# Patient Record
Sex: Female | Born: 1975 | Race: White | Hispanic: No | Marital: Single | Smoking: Current every day smoker
Health system: Southern US, Community
[De-identification: ages and names within clinical notes are randomized; demographics above are authoritative.]

## PROBLEM LIST (undated history)

## (undated) HISTORY — PX: DILATION AND CURETTAGE OF UTERUS: SHX78

## (undated) HISTORY — PX: CHOLECYSTECTOMY: SHX55

## (undated) HISTORY — PX: TUBAL LIGATION: SHX77

---

## 2015-12-12 ENCOUNTER — Emergency Department (HOSPITAL_COMMUNITY): Payer: BLUE CROSS/BLUE SHIELD

## 2015-12-12 ENCOUNTER — Encounter (HOSPITAL_COMMUNITY): Payer: Self-pay | Admitting: *Deleted

## 2015-12-12 ENCOUNTER — Emergency Department (HOSPITAL_COMMUNITY)
Admission: EM | Admit: 2015-12-12 | Discharge: 2015-12-12 | Disposition: A | Discharge status: Home / Self Care | Payer: BLUE CROSS/BLUE SHIELD | Attending: Emergency Medicine | Admitting: Emergency Medicine

## 2015-12-12 DIAGNOSIS — Y998 Other external cause status: Secondary | ICD-10-CM | POA: Diagnosis not present

## 2015-12-12 DIAGNOSIS — S93602A Unspecified sprain of left foot, initial encounter: Secondary | ICD-10-CM

## 2015-12-12 DIAGNOSIS — Y9289 Other specified places as the place of occurrence of the external cause: Secondary | ICD-10-CM | POA: Diagnosis not present

## 2015-12-12 DIAGNOSIS — S99922A Unspecified injury of left foot, initial encounter: Secondary | ICD-10-CM | POA: Diagnosis present

## 2015-12-12 DIAGNOSIS — W108XXA Fall (on) (from) other stairs and steps, initial encounter: Secondary | ICD-10-CM | POA: Insufficient documentation

## 2015-12-12 DIAGNOSIS — Y9389 Activity, other specified: Secondary | ICD-10-CM | POA: Diagnosis not present

## 2015-12-12 MED ORDER — OXYCODONE-ACETAMINOPHEN 5-325 MG PO TABS: 1.0000 | ORAL_TABLET | ORAL | Status: DC | PRN

## 2015-12-12 MED ORDER — OXYCODONE-ACETAMINOPHEN 5-325 MG PO TABS
1.0000 | ORAL_TABLET | Freq: Once | ORAL | Status: AC
  Administered 2015-12-12: 1 via ORAL
  Filled 2015-12-12: qty 1

## 2015-12-12 NOTE — ED Provider Notes (Signed)
CSN: [id number]     Arrival date & time 12/12/15  [id number] History   First MD Initiated Contact with Patient 12/12/15 [phone]     Chief Complaint  Patient presents with  . Fall    pain  to left foot while going down the stairs to basement per patient     (Consider location/radiation/quality/duration/timing/severity/associated sxs/prior Treatment) The history is provided by the patient.   40 year old female fell down about 4 steps injuring her left foot. She states the toes of her left foot curled under her. She rates pain at 8/10 with pain being concentrated in the first, second, third toes. She is unable to bear weight. She denies head, neck, back injury and denies other extremity injury. She did treat herself with MR nursing the foot in a bucket of ice water and took 800 mg ibuprofen.  No past medical history on file. No past surgical history on file. No family history on file. Social History  Substance Use Topics  . Smoking status: Not on file  . Smokeless tobacco: Not on file  . Alcohol Use: Not on file   OB History    No data available     Review of Systems  All other systems reviewed and are negative.     Allergies  Review of patient's allergies indicates not on file.  Home Medications   Prior to Admission medications   Not on File   BP 122/78 mmHg  Pulse 71  Temp(Src) 98.1 F (36.7 C) (Oral)  Resp 18  Ht 5\' 6"  (1.676 m)  Wt 185 lb (83.915 kg)  BMI 29.87 kg/m2  SpO2 100%  LMP 11/21/2015 Physical Exam  Nursing note and vitals reviewed.  40 year old female, resting comfortably and in no acute distress. Vital signs are normal. Oxygen saturation is 100%, which is normal. Head is normocephalic and atraumatic. PERRLA, EOMI. Oropharynx is clear. Neck is nontender and supple without adenopathy or JVD. Back is nontender and there is no CVA tenderness. Lungs are clear without rales, wheezes, or rhonchi. Chest is nontender. Heart has regular rate and rhythm without  murmur. Abdomen is soft, flat, nontender without masses or hepatosplenomegaly and peristalsis is normoactive. Extremities have no cyanosis or edema. There is moderate tenderness to palpation over the left second and third toes with moderate to severe tenderness to palpation over the left first toe and distal aspect of the left first metacarpal. No deformity is seen and there is minimal swelling. Capillary refill is prompt and sensation is normal. No other extremity injury is seen. Skin is warm and dry without rash. Neurologic: Mental status is normal, cranial nerves are intact, there are no motor or sensory deficits.  ED Course  Procedures (including critical care time) I have personally reviewed and evaluated these images and lab results as part of my medical decision-making.   MDM   Final diagnoses:  Fall down steps, initial encounter  Sprain of left foot, initial encounter    Fall with injury to left foot. X-rays have been ordered. She has no prior records in the [facility] system.  The first metatarsal itself is a well-corticated. Final radiology opinion is pending and patient case is signed out to Dr. Cyndie ChimeNguyen. She is placed in a postop shoe and given crutches and prescription for oxycodone-acetaminophen and will be referred to orthopedics for follow-up.  Dione Boozeavid Kathlene Yano, MD 12/12/15 2259

## 2015-12-12 NOTE — ED Notes (Signed)
Patient with no complaints at this time. Respirations even and unlabored. Skin warm/dry. Discharge instructions reviewed with patient at this time. Patient given opportunity to voice concerns/ask questions. Patient discharged at this time and left Emergency Department with steady gait.   

## 2015-12-12 NOTE — ED Provider Notes (Signed)
Patient signed out pending formal xray read.  Possible small avulsion fracture on xray.  Patient updated on results and plan of care.  Instructed to be non weight bearing and to follow up.  Patient expressed understanding and agreement with plan of care.  Discharged in stable condition.  Leta BaptistEmily Roe Emmet Messer, MD 12/12/15 [phone]

## 2015-12-12 NOTE — Discharge Instructions (Signed)
Wear post-op shoe, use crutches as needed. Take ibuprofen as needed for less severe pain.   Avulsion Fracture of the Foot An avulsion fracture of the foot is when a piece of bone in your foot has been torn away. Bones are connected to other bones by strong bands of connective tissue (ligaments). Muscles are also connected to bones with connective tissue (tendons). Avulsion fractures occur when severe stress on a bone from a ligament or tendon causes a small piece of bone to be pulled away.  Athletes may develop an avulsion fracture of the foot gradually (chronic avulsion fracture). The heel bone and the long bone in the foot that connect to the fifth toe (fifth metatarsal bone) are common areas of avulsion fracture of the foot.  CAUSES  An avulsion fracture of the foot can be caused by a sudden or repetitive twisting of your foot or ankle. It can also occur during a fall from a standing height.  RISK FACTORS You may have a higher risk of an avulsion fracture of the foot if you:   Participate in activities during which twisting the ankle or foot are likely, such as:  Dancing.  Track and field.  Walking or hiking on uneven surfaces.  Have had diabetes for many years.  Have osteoporosis. SIGNS AND SYMPTOMS The most common symptom of an avulsion fracture of the foot is intense pain at the time of injury. You may also feel a pop or tearing. The pain continues after the injury. Other signs and symptoms may include:  Swelling.  Bruising.  Pain with movement or weight bearing.  Difficulty walking.  Pain when pressure is applied to the injured area.  Warmth over the injured area. DIAGNOSIS  An avulsion fracture of the foot may be diagnosed by:  History. Your health care provider will ask you what occurred during the time of your injury and whether you had any pain in the area before your injury.  Physical exam. During the exam, your health care provider may try to move your foot,  toes, and ankle to check for pain and level of mobility.  X-ray. This will show if any bones are fractured or out of place.  MRI. This will show your tendons and ligaments. Some avulsion fractures are associated with an injury to a tendon or ligament. TREATMENT  Treatment for an avulsion fracture of the foot depends on the size of the displaced piece of bone and how far it has been pulled out of place. Small avulsion fractures may be treated with rest and support in a cast or brace. Large fragments of bone usually need to be reattached surgically. Treatment of these fractures may also require physical therapy to regain full use of your foot.  Treatments may include:  Rest, ice, compression, and elevations (RICE treatment) as directed by your health care provider.  Medicines that reduce pain and swelling (NSAIDs).  Wearing a splint, elastic wrap, support boot, or cast as directed by your health care provider.  Crutches or a rolling scooter to support your body weight until your foot heals.  Surgery to reattach the bone and tendon or ligament.  Physical therapy. This may last for several months. HOME CARE INSTRUCTIONS  Take medicines only as directed by your health care provider.  Rest your foot until your health care provider says you can resume activity.  Keep your foot raised above the level of your heart when you are sitting or lying down.  Apply ice to the injured area:  Put ice in a plastic bag.  Place a towel between your skin and the bag.  Leave the ice on for 20 minutes, 2-3 times a day.  Do not allow your cast or splint to get wet as directed by your health care provider.  Keep all follow-up visits as directed by your health care provider. This is important. SEEK MEDICAL CARE IF:  Your pain gets worse.  You have chills or fever.  Your cast or splint is damaged.  The cast has a bad odor or has stains caused by fluids from the wound. SEEK IMMEDIATE MEDICAL CARE  IF:  Your foot is cold, blue, or pale.  You have pain, swelling, redness, or numbness below your cast or splint.   This information is not intended to replace advice given to you by your health care provider. Make sure you discuss any questions you have with your health care provider.   Document Released: 06/17/2014 Document Reviewed: 06/17/2014 Elsevier Interactive Patient Education [employer]2016 [employer] Inc.

## 2015-12-14 ENCOUNTER — Encounter: Payer: Self-pay | Admitting: *Deleted

## 2015-12-14 ENCOUNTER — Ambulatory Visit (INDEPENDENT_AMBULATORY_CARE_PROVIDER_SITE_OTHER): Payer: BLUE CROSS/BLUE SHIELD | Admitting: Orthopedic Surgery

## 2015-12-14 ENCOUNTER — Encounter: Payer: Self-pay | Admitting: Orthopedic Surgery

## 2015-12-14 VITALS — BP 118/71 | Ht 66.0 in | Wt 185.0 lb

## 2015-12-14 DIAGNOSIS — S93529A Sprain of metatarsophalangeal joint of unspecified toe(s), initial encounter: Secondary | ICD-10-CM | POA: Diagnosis not present

## 2015-12-14 NOTE — Progress Notes (Signed)
Patient ID: Elizabeth Richards, female   DOB: 05/05/1976, 40 y.o.   MRN: [id number]  Chief Complaint  Patient presents with  . Toe Injury    toes fracture, left foot, DOI 12/12/15    HPI Elizabeth IshikawaChristi Richards is a 40 y.o. female.  The patient reports that she slipped on her basement steps are toe folded up under her and she complains of pain over the right great toe at the MTP joint. Quality dull ache. Severity 7+ out of 10 duration 2 days time and constant context as described  Review of Systems Review of Systems Neurologic symptoms are negative skin is intact no lacerations, no redness, no fever no chills  Patient has no recent or significant medical history surgical history or family history  Social History Social History  Substance Use Topics  . Smoking status: Never Smoker   . Smokeless tobacco: Not on file  . Alcohol Use: Not on file    No Known Allergies  Current Outpatient Prescriptions  Medication Sig Dispense Refill  . citalopram (CELEXA) 40 MG tablet Take 40 mg by mouth daily.    . Multiple Vitamin (MULTIVITAMIN) capsule Take 1 capsule by mouth daily.    Marland Kitchen. omeprazole (PRILOSEC) 20 MG capsule Take 20 mg by mouth daily.    Marland Kitchen. oxyCODONE-acetaminophen (PERCOCET) 5-325 MG tablet Take 1 tablet by mouth every 4 (four) hours as needed for moderate pain. 20 tablet 0   No current facility-administered medications for this visit.       Physical Exam Physical Exam Blood pressure 118/71, height 5\' 6"  (1.676 m), weight 185 lb (83.915 kg), last menstrual period 11/21/2015. Appearance, there are no abnormalities in terms of appearance the patient was well-developed and well-nourished. The grooming and hygiene were normal.  Mental status orientation, there was normal alertness and orientation Mood pleasant Ambulatory abnormal gait secondary to crutches pain and inability to bear full weight  Examination of the right foot Inspection tenderness and swelling over the great toe MTP joint Range  of motion painful passive range of motion pain limits overall range of motion Tests for stability tests for stability are deferred because of pain and swelling Motor strength  muscle tone normal Skin warm dry and intact without laceration or ulceration or erythema Neurologic examination normal sensation, balance is normal, coordination normal Vascular examination normal pulses with warm extremity and normal capillary refill     Data Reviewed XRAYS, I INTERPRET AS AVULSION fracture of the metatarsal head making this most likely a metatarsal phalangeal joint sprain with avulsion fracture  Assessment  Turf toe MTP joint sprain right foot great toe   Plan  Weight-bear as tolerated, out of work next 4 weeks recheck 4 weeks  short Cam Walker

## 2015-12-14 NOTE — Patient Instructions (Signed)
Out of work 4 weeks 

## 2015-12-22 ENCOUNTER — Telehealth: Payer: Self-pay | Admitting: *Deleted

## 2015-12-22 NOTE — Telephone Encounter (Signed)
Patient called requesting something for pain in her left foot, pain is mainly occuring at night when she gets off of it. Please advise [phone] patient uses [facility] in [place].

## 2015-12-23 ENCOUNTER — Other Ambulatory Visit: Payer: Self-pay | Admitting: *Deleted

## 2015-12-23 MED ORDER — IBUPROFEN 800 MG PO TABS: 800.0000 mg | ORAL_TABLET | Freq: Three times a day (TID) | ORAL | Status: DC | PRN

## 2015-12-23 NOTE — Telephone Encounter (Signed)
IBUPROFEN 800 TID 90TABS

## 2015-12-23 NOTE — Telephone Encounter (Signed)
Routing to Dr Harrison for review 

## 2015-12-23 NOTE — Telephone Encounter (Signed)
MEDICATION SENT, PATIENT AWARE 

## 2016-01-11 ENCOUNTER — Encounter: Payer: Self-pay | Admitting: Orthopedic Surgery

## 2016-01-11 ENCOUNTER — Ambulatory Visit (INDEPENDENT_AMBULATORY_CARE_PROVIDER_SITE_OTHER): Payer: BLUE CROSS/BLUE SHIELD | Admitting: Orthopedic Surgery

## 2016-01-11 VITALS — BP 107/66 | Ht 66.0 in | Wt 185.0 lb

## 2016-01-11 DIAGNOSIS — S93529D Sprain of metatarsophalangeal joint of unspecified toe(s), subsequent encounter: Secondary | ICD-10-CM

## 2016-01-11 NOTE — Progress Notes (Signed)
Patient ID: Elizabeth Richards, female   DOB: 12-22-1975, 40 y.o.   MRN: [id number]  Chief Complaint  Patient presents with  . Follow-up    4 WEEK FOLLOW UP RIGHT GREAT TOE, DOI 12/12/15    BP 107/66 mmHg  Ht  (1.676 m)  Wt 185 lb (83.915 kg)  BMI 29.87 kg/m2  LMP 11/21/2015  Patient injured her great toe on the right foot we treated her with immobilization. She is now a month out. She complains of no discomfort  Review of systems stiffness in the great toe  Exam vital signs stable as recorded  Patient awake alert and oriented 3 Mood and affect normal Amplitude status returned to normal shoes normal gait Stiffness in the toe but passive range of motion normal minimal tenderness no instability neurovascular exam intact    ASSESSMENT AND PLAN   Resolved turf toe  Passive range of motion exercises instructed patient will do on her own  Return to work tomorrow  Follow-up as needed

## 2016-01-11 NOTE — Patient Instructions (Addendum)
RETURN TO WORK 01/12/16  TOE STRETCHES AT HOME

## 2016-03-17 DIAGNOSIS — R05 Cough: Secondary | ICD-10-CM | POA: Diagnosis not present

## 2016-03-17 DIAGNOSIS — Z716 Tobacco abuse counseling: Secondary | ICD-10-CM | POA: Diagnosis not present

## 2016-03-17 DIAGNOSIS — Z6831 Body mass index (BMI) 31.0-31.9, adult: Secondary | ICD-10-CM | POA: Diagnosis not present

## 2016-03-17 DIAGNOSIS — R062 Wheezing: Secondary | ICD-10-CM | POA: Diagnosis not present

## 2016-04-25 DIAGNOSIS — Z1389 Encounter for screening for other disorder: Secondary | ICD-10-CM | POA: Diagnosis not present

## 2016-04-25 DIAGNOSIS — Z6832 Body mass index (BMI) 32.0-32.9, adult: Secondary | ICD-10-CM | POA: Diagnosis not present

## 2016-04-25 DIAGNOSIS — F3341 Major depressive disorder, recurrent, in partial remission: Secondary | ICD-10-CM | POA: Diagnosis not present

## 2016-04-25 DIAGNOSIS — F411 Generalized anxiety disorder: Secondary | ICD-10-CM | POA: Diagnosis not present

## 2016-04-28 DIAGNOSIS — Z6832 Body mass index (BMI) 32.0-32.9, adult: Secondary | ICD-10-CM | POA: Diagnosis not present

## 2016-04-28 DIAGNOSIS — L03032 Cellulitis of left toe: Secondary | ICD-10-CM | POA: Diagnosis not present

## 2016-08-14 DIAGNOSIS — Z6833 Body mass index (BMI) 33.0-33.9, adult: Secondary | ICD-10-CM | POA: Diagnosis not present

## 2016-08-14 DIAGNOSIS — K210 Gastro-esophageal reflux disease with esophagitis: Secondary | ICD-10-CM | POA: Diagnosis not present

## 2016-08-14 DIAGNOSIS — F3341 Major depressive disorder, recurrent, in partial remission: Secondary | ICD-10-CM | POA: Diagnosis not present

## 2016-08-14 DIAGNOSIS — F411 Generalized anxiety disorder: Secondary | ICD-10-CM | POA: Diagnosis not present

## 2016-08-14 DIAGNOSIS — Z23 Encounter for immunization: Secondary | ICD-10-CM | POA: Diagnosis not present

## 2016-09-15 DIAGNOSIS — J209 Acute bronchitis, unspecified: Secondary | ICD-10-CM | POA: Diagnosis not present

## 2016-09-15 DIAGNOSIS — J0101 Acute recurrent maxillary sinusitis: Secondary | ICD-10-CM | POA: Diagnosis not present

## 2016-09-15 DIAGNOSIS — Z6832 Body mass index (BMI) 32.0-32.9, adult: Secondary | ICD-10-CM | POA: Diagnosis not present

## 2016-10-20 DIAGNOSIS — L03311 Cellulitis of abdominal wall: Secondary | ICD-10-CM | POA: Diagnosis not present

## 2016-10-20 DIAGNOSIS — Z6831 Body mass index (BMI) 31.0-31.9, adult: Secondary | ICD-10-CM | POA: Diagnosis not present

## 2016-11-10 DIAGNOSIS — N938 Other specified abnormal uterine and vaginal bleeding: Secondary | ICD-10-CM | POA: Diagnosis not present

## 2016-11-10 DIAGNOSIS — Z6832 Body mass index (BMI) 32.0-32.9, adult: Secondary | ICD-10-CM | POA: Diagnosis not present

## 2016-12-15 DIAGNOSIS — Z720 Tobacco use: Secondary | ICD-10-CM | POA: Diagnosis not present

## 2016-12-15 DIAGNOSIS — N945 Secondary dysmenorrhea: Secondary | ICD-10-CM | POA: Diagnosis not present

## 2016-12-15 DIAGNOSIS — K210 Gastro-esophageal reflux disease with esophagitis: Secondary | ICD-10-CM | POA: Diagnosis not present

## 2016-12-15 DIAGNOSIS — F3341 Major depressive disorder, recurrent, in partial remission: Secondary | ICD-10-CM | POA: Diagnosis not present

## 2016-12-15 DIAGNOSIS — F411 Generalized anxiety disorder: Secondary | ICD-10-CM | POA: Diagnosis not present

## 2016-12-19 DIAGNOSIS — Z6831 Body mass index (BMI) 31.0-31.9, adult: Secondary | ICD-10-CM | POA: Diagnosis not present

## 2016-12-19 DIAGNOSIS — J09X2 Influenza due to identified novel influenza A virus with other respiratory manifestations: Secondary | ICD-10-CM | POA: Diagnosis not present

## 2017-03-01 DIAGNOSIS — M76821 Posterior tibial tendinitis, right leg: Secondary | ICD-10-CM | POA: Diagnosis not present

## 2017-03-01 DIAGNOSIS — M722 Plantar fascial fibromatosis: Secondary | ICD-10-CM | POA: Diagnosis not present

## 2017-03-01 DIAGNOSIS — M7731 Calcaneal spur, right foot: Secondary | ICD-10-CM | POA: Diagnosis not present

## 2017-03-01 DIAGNOSIS — M71571 Other bursitis, not elsewhere classified, right ankle and foot: Secondary | ICD-10-CM | POA: Diagnosis not present

## 2017-03-09 DIAGNOSIS — M722 Plantar fascial fibromatosis: Secondary | ICD-10-CM | POA: Diagnosis not present

## 2017-03-09 DIAGNOSIS — M71571 Other bursitis, not elsewhere classified, right ankle and foot: Secondary | ICD-10-CM | POA: Diagnosis not present

## 2017-04-06 DIAGNOSIS — L0291 Cutaneous abscess, unspecified: Secondary | ICD-10-CM | POA: Diagnosis not present

## 2017-04-11 DIAGNOSIS — L600 Ingrowing nail: Secondary | ICD-10-CM | POA: Diagnosis not present

## 2017-04-13 DIAGNOSIS — Z6833 Body mass index (BMI) 33.0-33.9, adult: Secondary | ICD-10-CM | POA: Diagnosis not present

## 2017-04-13 DIAGNOSIS — F411 Generalized anxiety disorder: Secondary | ICD-10-CM | POA: Diagnosis not present

## 2017-04-13 DIAGNOSIS — F3341 Major depressive disorder, recurrent, in partial remission: Secondary | ICD-10-CM | POA: Diagnosis not present

## 2017-04-13 DIAGNOSIS — K210 Gastro-esophageal reflux disease with esophagitis: Secondary | ICD-10-CM | POA: Diagnosis not present

## 2017-04-13 DIAGNOSIS — Z720 Tobacco use: Secondary | ICD-10-CM | POA: Diagnosis not present

## 2017-07-10 DIAGNOSIS — Z6834 Body mass index (BMI) 34.0-34.9, adult: Secondary | ICD-10-CM | POA: Diagnosis not present

## 2017-07-10 DIAGNOSIS — M545 Low back pain: Secondary | ICD-10-CM | POA: Diagnosis not present

## 2017-07-10 DIAGNOSIS — S39012A Strain of muscle, fascia and tendon of lower back, initial encounter: Secondary | ICD-10-CM | POA: Diagnosis not present

## 2017-08-10 DIAGNOSIS — Z6835 Body mass index (BMI) 35.0-35.9, adult: Secondary | ICD-10-CM | POA: Diagnosis not present

## 2017-08-10 DIAGNOSIS — H6502 Acute serous otitis media, left ear: Secondary | ICD-10-CM | POA: Diagnosis not present

## 2017-10-05 DIAGNOSIS — Z720 Tobacco use: Secondary | ICD-10-CM | POA: Diagnosis not present

## 2017-10-05 DIAGNOSIS — F411 Generalized anxiety disorder: Secondary | ICD-10-CM | POA: Diagnosis not present

## 2017-10-05 DIAGNOSIS — K210 Gastro-esophageal reflux disease with esophagitis: Secondary | ICD-10-CM | POA: Diagnosis not present

## 2017-10-05 DIAGNOSIS — Z23 Encounter for immunization: Secondary | ICD-10-CM | POA: Diagnosis not present

## 2017-10-05 DIAGNOSIS — F3341 Major depressive disorder, recurrent, in partial remission: Secondary | ICD-10-CM | POA: Diagnosis not present

## 2017-12-07 DIAGNOSIS — Z6832 Body mass index (BMI) 32.0-32.9, adult: Secondary | ICD-10-CM | POA: Diagnosis not present

## 2017-12-07 DIAGNOSIS — M7711 Lateral epicondylitis, right elbow: Secondary | ICD-10-CM | POA: Diagnosis not present

## 2018-01-08 DIAGNOSIS — J0101 Acute recurrent maxillary sinusitis: Secondary | ICD-10-CM | POA: Diagnosis not present

## 2018-01-08 DIAGNOSIS — Z6831 Body mass index (BMI) 31.0-31.9, adult: Secondary | ICD-10-CM | POA: Diagnosis not present

## 2018-02-08 DIAGNOSIS — F3341 Major depressive disorder, recurrent, in partial remission: Secondary | ICD-10-CM | POA: Diagnosis not present

## 2018-02-08 DIAGNOSIS — Z1389 Encounter for screening for other disorder: Secondary | ICD-10-CM | POA: Diagnosis not present

## 2018-02-08 DIAGNOSIS — F411 Generalized anxiety disorder: Secondary | ICD-10-CM | POA: Diagnosis not present

## 2018-02-08 DIAGNOSIS — Z720 Tobacco use: Secondary | ICD-10-CM | POA: Diagnosis not present

## 2018-02-08 DIAGNOSIS — K210 Gastro-esophageal reflux disease with esophagitis: Secondary | ICD-10-CM | POA: Diagnosis not present

## 2018-02-26 DIAGNOSIS — F419 Anxiety disorder, unspecified: Secondary | ICD-10-CM | POA: Diagnosis not present

## 2018-03-07 DIAGNOSIS — J309 Allergic rhinitis, unspecified: Secondary | ICD-10-CM | POA: Diagnosis not present

## 2018-03-07 DIAGNOSIS — Z6831 Body mass index (BMI) 31.0-31.9, adult: Secondary | ICD-10-CM | POA: Diagnosis not present

## 2018-03-07 DIAGNOSIS — J069 Acute upper respiratory infection, unspecified: Secondary | ICD-10-CM | POA: Diagnosis not present

## 2018-03-12 ENCOUNTER — Other Ambulatory Visit: Payer: Self-pay

## 2018-03-12 ENCOUNTER — Encounter (HOSPITAL_COMMUNITY): Payer: Self-pay | Admitting: *Deleted

## 2018-03-12 ENCOUNTER — Emergency Department (HOSPITAL_COMMUNITY)
Admission: EM | Admit: 2018-03-12 | Discharge: 2018-03-12 | Disposition: A | Discharge status: Home / Self Care | Payer: BLUE CROSS/BLUE SHIELD | Attending: Emergency Medicine | Admitting: Emergency Medicine

## 2018-03-12 ENCOUNTER — Emergency Department (HOSPITAL_COMMUNITY): Payer: BLUE CROSS/BLUE SHIELD

## 2018-03-12 DIAGNOSIS — F17200 Nicotine dependence, unspecified, uncomplicated: Secondary | ICD-10-CM | POA: Insufficient documentation

## 2018-03-12 DIAGNOSIS — S6722XA Crushing injury of left hand, initial encounter: Secondary | ICD-10-CM | POA: Diagnosis present

## 2018-03-12 DIAGNOSIS — Z79899 Other long term (current) drug therapy: Secondary | ICD-10-CM | POA: Insufficient documentation

## 2018-03-12 DIAGNOSIS — Y939 Activity, unspecified: Secondary | ICD-10-CM | POA: Diagnosis not present

## 2018-03-12 DIAGNOSIS — S6992XA Unspecified injury of left wrist, hand and finger(s), initial encounter: Secondary | ICD-10-CM | POA: Diagnosis not present

## 2018-03-12 DIAGNOSIS — Y999 Unspecified external cause status: Secondary | ICD-10-CM | POA: Diagnosis not present

## 2018-03-12 DIAGNOSIS — S60022A Contusion of left index finger without damage to nail, initial encounter: Secondary | ICD-10-CM | POA: Diagnosis not present

## 2018-03-12 DIAGNOSIS — W231XXA Caught, crushed, jammed, or pinched between stationary objects, initial encounter: Secondary | ICD-10-CM | POA: Diagnosis not present

## 2018-03-12 DIAGNOSIS — Y929 Unspecified place or not applicable: Secondary | ICD-10-CM | POA: Insufficient documentation

## 2018-03-12 MED ORDER — HYDROCODONE-ACETAMINOPHEN 5-325 MG PO TABS
1.0000 | ORAL_TABLET | Freq: Once | ORAL | Status: AC
  Administered 2018-03-12: 1 via ORAL
  Filled 2018-03-12: qty 1

## 2018-03-12 MED ORDER — HYDROCODONE-ACETAMINOPHEN 5-325 MG PO TABS: ORAL_TABLET | ORAL | 0 refills | Status: AC

## 2018-03-12 MED ORDER — IBUPROFEN 800 MG PO TABS: 800.0000 mg | ORAL_TABLET | Freq: Three times a day (TID) | ORAL | 0 refills | Status: DC

## 2018-03-12 MED ORDER — IBUPROFEN 800 MG PO TABS
800.0000 mg | ORAL_TABLET | Freq: Once | ORAL | Status: AC
  Administered 2018-03-12: 800 mg via ORAL
  Filled 2018-03-12: qty 1

## 2018-03-12 NOTE — Discharge Instructions (Addendum)
Keep your finger elevated and apply ice packs on and off to help reduce swelling.  You may wear the splint as needed for protection.  Call Dr. Mort SawyersHarrison's office to arrange a follow-up appointment in 1 week if needed.  Return to the ER for any worsening pain or swelling of the finger or if you notice discoloration to the fingernail.

## 2018-03-12 NOTE — ED Provider Notes (Signed)
[facility] EMERGENCY DEPARTMENT Provider Note   CSN: [id number] Arrival date & time: 03/12/18  2111     History   Chief Complaint Chief Complaint  Patient presents with  . Finger Injury    HPI Elizabeth Richards is a 42 y.o. female.  HPI  Elizabeth Richards is a 42 y.o. female who presents to the Emergency Department complaining of swelling and throbbing pain to the distal end of her left index finger.  She states the pain began at 430 today when she messed her finger between 2 pieces of wood.  She describes a "heartbeat" to the end of her finger.  She has applied ice packs without relief.  She states the pain is worse with attempted movement of the distal end of her finger.  She denies injuries to the other fingers hand or wrist.  She denies numbness of the finger or discoloration of the nail or lacerations.   History reviewed. No pertinent past medical history.  There are no active problems to display for this patient.   Past Surgical History:  Procedure Laterality Date  . CHOLECYSTECTOMY    . DILATION AND CURETTAGE OF UTERUS    . TUBAL LIGATION       OB History   None      Home Medications    Prior to Admission medications   Medication Sig Start Date End Date Taking? Authorizing Provider  citalopram (CELEXA) 40 MG tablet Take 40 mg by mouth daily.    [provider]  HYDROcodone-acetaminophen (NORCO/VICODIN) 5-325 MG tablet Take one tab po q 4 hrs prn pain 03/12/18   Zyen Triggs, PA-C  ibuprofen (ADVIL,MOTRIN) 800 MG tablet Take 1 tablet (800 mg total) by mouth 3 (three) times daily. 03/12/18   Colten Desroches, PA-C  Multiple Vitamin (MULTIVITAMIN) capsule Take 1 capsule by mouth daily.    [provider]  omeprazole (PRILOSEC) 20 MG capsule Take 20 mg by mouth daily.    [provider]    Family History No family history on file.  Social History Social History   Tobacco Use  . Smoking status: Current Every Day Smoker  . Smokeless  tobacco: Never Used  Substance Use Topics  . Alcohol use: Not Currently  . Drug use: Never     Allergies   Patient has no known allergies.   Review of Systems Review of Systems  Constitutional: Negative for chills and fever.  Musculoskeletal: Positive for arthralgias (Pain left index finger) and joint swelling.  Skin: Negative for color change and wound.  Neurological: Negative for weakness and numbness.  All other systems reviewed and are negative.    Physical Exam Updated Vital Signs BP (!) 142/96 (BP Location: Right Arm)   Pulse 72   Temp 98.4 F (36.9 C) (Oral)   Resp 15   Ht 5\' 6"  (1.676 m)   Wt 87.1 kg (192 lb)   LMP 03/03/2018   SpO2 99%   BMI 30.99 kg/m   Physical Exam  Constitutional: She is oriented to person, place, and time. She appears well-developed and well-nourished. No distress.  HENT:  Head: Normocephalic and atraumatic.  Cardiovascular: Normal rate, regular rhythm and intact distal pulses.  Pulmonary/Chest: Effort normal and breath sounds normal.  Musculoskeletal: She exhibits tenderness. She exhibits no edema or deformity.  Focal tenderness to the distal and of the left index finger.  No significant edema of the finger, no bony deformity or open wound.  No subungual hematoma.  Pain with movement at the  DIP  Neurological: She is alert and oriented to person, place, and time. No sensory deficit. She exhibits normal muscle tone. Coordination normal.  Skin: Skin is warm and dry. Capillary refill takes less than 2 seconds.  Psychiatric: She has a normal mood and affect.  Nursing note and vitals reviewed.    ED Treatments / Results  Labs (all labs ordered are listed, but only abnormal results are displayed) Labs Reviewed - No data to display  EKG None  Radiology Dg Finger Index Left  Result Date: 03/12/2018 CLINICAL DATA:  Blunt trauma, finger between wood and wood splitter. EXAM: LEFT INDEX FINGER 2+V COMPARISON:  None. FINDINGS: There is no  evidence of fracture or dislocation. There is no evidence of arthropathy or other focal bone abnormality. Soft tissues are unremarkable. IMPRESSION: Negative. Electronically Signed   By: Awilda Metroourtnay  Bloomer M.D.   On: 03/12/2018 21:54    Procedures Procedures (including critical care time)  Medications Ordered in ED Medications  ibuprofen (ADVIL,MOTRIN) tablet 800 mg (800 mg Oral Given 03/12/18 2148)  HYDROcodone-acetaminophen (NORCO/VICODIN) 5-325 MG per tablet 1 tablet (1 tablet Oral Given 03/12/18 2148)     Initial Impression / Assessment and Plan / ED Course  I have reviewed the triage vital signs and the nursing notes.  Pertinent labs & imaging results that were available during my care of the patient were reviewed by me and considered in my medical decision making (see chart for details).     Patient with likely contusion of the distal left index finger.  No subungual hematoma.  No open wound.  Neurovascularly intact.  Finger splinted, patient agrees to elevate, ice, and NSAID for pain relief.  Although patient does not have a subungual hematoma at present I have discussed with her the possibility of this and need for ER return if it develops.  She verbalized understanding and agrees to plan.    Final Clinical Impressions(s) / ED Diagnoses   Final diagnoses:  Contusion of left index finger without damage to nail, initial encounter    ED Discharge Orders        Ordered    HYDROcodone-acetaminophen (NORCO/VICODIN) 5-325 MG tablet     03/12/18 2230    ibuprofen (ADVIL,MOTRIN) 800 MG tablet  3 times daily     03/12/18 2230       Pauline Ausriplett, Cutberto Winfree, PA-C 03/12/18 2247    Doug SouJacubowitz, Sam, MD 03/12/18 [phone]

## 2018-03-12 NOTE — ED Triage Notes (Signed)
Pt smashed left index finger between a piece of wood, c/o pain to that area.

## 2018-03-13 MED FILL — Hydrocodone-Acetaminophen Tab 5-325 MG: ORAL | Qty: 6 | Status: AC

## 2018-04-26 DIAGNOSIS — Z6831 Body mass index (BMI) 31.0-31.9, adult: Secondary | ICD-10-CM | POA: Diagnosis not present

## 2018-04-26 DIAGNOSIS — E782 Mixed hyperlipidemia: Secondary | ICD-10-CM | POA: Diagnosis not present

## 2018-06-11 DIAGNOSIS — Z6832 Body mass index (BMI) 32.0-32.9, adult: Secondary | ICD-10-CM | POA: Diagnosis not present

## 2018-06-11 DIAGNOSIS — S46012A Strain of muscle(s) and tendon(s) of the rotator cuff of left shoulder, initial encounter: Secondary | ICD-10-CM | POA: Diagnosis not present

## 2018-06-25 DIAGNOSIS — S46012A Strain of muscle(s) and tendon(s) of the rotator cuff of left shoulder, initial encounter: Secondary | ICD-10-CM | POA: Diagnosis not present

## 2018-07-05 DIAGNOSIS — M25512 Pain in left shoulder: Secondary | ICD-10-CM | POA: Diagnosis not present

## 2018-07-10 DIAGNOSIS — S46812A Strain of other muscles, fascia and tendons at shoulder and upper arm level, left arm, initial encounter: Secondary | ICD-10-CM | POA: Diagnosis not present

## 2018-07-10 DIAGNOSIS — M7592 Shoulder lesion, unspecified, left shoulder: Secondary | ICD-10-CM | POA: Diagnosis not present

## 2018-07-12 DIAGNOSIS — M25512 Pain in left shoulder: Secondary | ICD-10-CM | POA: Diagnosis not present

## 2018-07-17 DIAGNOSIS — M25512 Pain in left shoulder: Secondary | ICD-10-CM | POA: Diagnosis not present

## 2018-07-17 DIAGNOSIS — M67814 Other specified disorders of tendon, left shoulder: Secondary | ICD-10-CM | POA: Diagnosis not present

## 2018-07-25 DIAGNOSIS — M25512 Pain in left shoulder: Secondary | ICD-10-CM | POA: Diagnosis not present

## 2018-07-25 DIAGNOSIS — M67814 Other specified disorders of tendon, left shoulder: Secondary | ICD-10-CM | POA: Diagnosis not present

## 2018-07-26 DIAGNOSIS — M25512 Pain in left shoulder: Secondary | ICD-10-CM | POA: Diagnosis not present

## 2018-07-31 DIAGNOSIS — M24112 Other articular cartilage disorders, left shoulder: Secondary | ICD-10-CM | POA: Diagnosis not present

## 2018-07-31 DIAGNOSIS — G8918 Other acute postprocedural pain: Secondary | ICD-10-CM | POA: Diagnosis not present

## 2018-07-31 DIAGNOSIS — M7542 Impingement syndrome of left shoulder: Secondary | ICD-10-CM | POA: Diagnosis not present

## 2018-07-31 DIAGNOSIS — M7552 Bursitis of left shoulder: Secondary | ICD-10-CM | POA: Diagnosis not present

## 2018-08-07 DIAGNOSIS — M25512 Pain in left shoulder: Secondary | ICD-10-CM | POA: Diagnosis not present

## 2018-08-07 DIAGNOSIS — M67814 Other specified disorders of tendon, left shoulder: Secondary | ICD-10-CM | POA: Diagnosis not present

## 2018-08-09 DIAGNOSIS — M25512 Pain in left shoulder: Secondary | ICD-10-CM | POA: Diagnosis not present

## 2018-08-09 DIAGNOSIS — E782 Mixed hyperlipidemia: Secondary | ICD-10-CM | POA: Diagnosis not present

## 2018-08-13 DIAGNOSIS — J209 Acute bronchitis, unspecified: Secondary | ICD-10-CM | POA: Diagnosis not present

## 2018-08-13 DIAGNOSIS — J0101 Acute recurrent maxillary sinusitis: Secondary | ICD-10-CM | POA: Diagnosis not present

## 2018-08-13 DIAGNOSIS — Z6832 Body mass index (BMI) 32.0-32.9, adult: Secondary | ICD-10-CM | POA: Diagnosis not present

## 2018-08-13 DIAGNOSIS — R05 Cough: Secondary | ICD-10-CM | POA: Diagnosis not present

## 2018-08-16 DIAGNOSIS — Z720 Tobacco use: Secondary | ICD-10-CM | POA: Diagnosis not present

## 2018-08-16 DIAGNOSIS — F411 Generalized anxiety disorder: Secondary | ICD-10-CM | POA: Diagnosis not present

## 2018-08-16 DIAGNOSIS — K210 Gastro-esophageal reflux disease with esophagitis: Secondary | ICD-10-CM | POA: Diagnosis not present

## 2018-08-16 DIAGNOSIS — F3341 Major depressive disorder, recurrent, in partial remission: Secondary | ICD-10-CM | POA: Diagnosis not present

## 2018-08-20 DIAGNOSIS — M67814 Other specified disorders of tendon, left shoulder: Secondary | ICD-10-CM | POA: Diagnosis not present

## 2018-08-20 DIAGNOSIS — M25512 Pain in left shoulder: Secondary | ICD-10-CM | POA: Diagnosis not present

## 2018-08-22 DIAGNOSIS — M67814 Other specified disorders of tendon, left shoulder: Secondary | ICD-10-CM | POA: Diagnosis not present

## 2018-08-22 DIAGNOSIS — M25512 Pain in left shoulder: Secondary | ICD-10-CM | POA: Diagnosis not present

## 2018-09-09 ENCOUNTER — Emergency Department (HOSPITAL_COMMUNITY)
Admission: EM | Admit: 2018-09-09 | Discharge: 2018-09-09 | Disposition: A | Discharge status: Home / Self Care | Payer: BLUE CROSS/BLUE SHIELD | Attending: Emergency Medicine | Admitting: Emergency Medicine

## 2018-09-09 ENCOUNTER — Encounter (HOSPITAL_COMMUNITY): Payer: Self-pay | Admitting: Emergency Medicine

## 2018-09-09 ENCOUNTER — Emergency Department (HOSPITAL_COMMUNITY): Payer: BLUE CROSS/BLUE SHIELD

## 2018-09-09 ENCOUNTER — Other Ambulatory Visit: Payer: Self-pay

## 2018-09-09 DIAGNOSIS — Y93E2 Activity, laundry: Secondary | ICD-10-CM | POA: Diagnosis not present

## 2018-09-09 DIAGNOSIS — Y999 Unspecified external cause status: Secondary | ICD-10-CM | POA: Diagnosis not present

## 2018-09-09 DIAGNOSIS — F17200 Nicotine dependence, unspecified, uncomplicated: Secondary | ICD-10-CM | POA: Diagnosis not present

## 2018-09-09 DIAGNOSIS — S43422A Sprain of left rotator cuff capsule, initial encounter: Secondary | ICD-10-CM

## 2018-09-09 DIAGNOSIS — S4992XA Unspecified injury of left shoulder and upper arm, initial encounter: Secondary | ICD-10-CM | POA: Diagnosis present

## 2018-09-09 DIAGNOSIS — Y92003 Bedroom of unspecified non-institutional (private) residence as the place of occurrence of the external cause: Secondary | ICD-10-CM | POA: Diagnosis not present

## 2018-09-09 DIAGNOSIS — Y33XXXA Other specified events, undetermined intent, initial encounter: Secondary | ICD-10-CM | POA: Diagnosis not present

## 2018-09-09 DIAGNOSIS — M25512 Pain in left shoulder: Secondary | ICD-10-CM | POA: Diagnosis not present

## 2018-09-09 DIAGNOSIS — Z79899 Other long term (current) drug therapy: Secondary | ICD-10-CM | POA: Diagnosis not present

## 2018-09-09 MED ORDER — PREDNISONE 50 MG PO TABS
60.0000 mg | ORAL_TABLET | Freq: Once | ORAL | Status: AC
  Administered 2018-09-09: 60 mg via ORAL
  Filled 2018-09-09: qty 1

## 2018-09-09 MED ORDER — KETOROLAC TROMETHAMINE 60 MG/2ML IM SOLN
60.0000 mg | Freq: Once | INTRAMUSCULAR | Status: AC
  Administered 2018-09-09: 60 mg via INTRAMUSCULAR
  Filled 2018-09-09: qty 2

## 2018-09-09 MED ORDER — IBUPROFEN 600 MG PO TABS: 600.0000 mg | ORAL_TABLET | Freq: Four times a day (QID) | ORAL | 0 refills | Status: AC | PRN

## 2018-09-09 MED ORDER — PREDNISONE 20 MG PO TABS: ORAL_TABLET | ORAL | 0 refills | Status: AC

## 2018-09-09 MED ORDER — ACETAMINOPHEN 500 MG PO TABS
1000.0000 mg | ORAL_TABLET | Freq: Once | ORAL | Status: AC
  Administered 2018-09-09: 1000 mg via ORAL
  Filled 2018-09-09: qty 2

## 2018-09-09 MED ORDER — ACETAMINOPHEN 500 MG PO TABS: 1000.0000 mg | ORAL_TABLET | Freq: Four times a day (QID) | ORAL | 0 refills | Status: AC | PRN

## 2018-09-09 NOTE — ED Provider Notes (Signed)
[facility] EMERGENCY DEPARTMENT Provider Note   CSN: [id number] Arrival date & time: 09/09/18  2053     History   Chief Complaint Chief Complaint  Patient presents with  . Shoulder Pain    HPI Elizabeth Richards is a 42 y.o. female.  HPI Patient with history of left rotator cuff injury status post procedure orthopedic 2 months ago.  States she was putting sheets on her mattress when she felt a pop in her left shoulder and immediate pain.  She states she had temporary tingling sensation to the tips of her fingers.  She admits that she has had ongoing pain to the left shoulder since her procedure.  Denies neck pain.  No focal weakness. History reviewed. No pertinent past medical history.  There are no active problems to display for this patient.   Past Surgical History:  Procedure Laterality Date  . CHOLECYSTECTOMY    . DILATION AND CURETTAGE OF UTERUS    . TUBAL LIGATION       OB History   None      Home Medications    Prior to Admission medications   Medication Sig Start Date End Date Taking? Authorizing Provider  acetaminophen (TYLENOL) 500 MG tablet Take 2 tablets (1,000 mg total) by mouth every 6 (six) hours as needed for moderate pain. 09/09/18   Loren Racer, MD  citalopram (CELEXA) 40 MG tablet Take 40 mg by mouth daily.    [provider]  HYDROcodone-acetaminophen (NORCO/VICODIN) 5-325 MG tablet Take one tab po q 4 hrs prn pain 03/12/18   Triplett, Tammy, PA-C  ibuprofen (ADVIL,MOTRIN) 600 MG tablet Take 1 tablet (600 mg total) by mouth every 6 (six) hours as needed. 09/09/18   Loren Racer, MD  Multiple Vitamin (MULTIVITAMIN) capsule Take 1 capsule by mouth daily.    [provider]  omeprazole (PRILOSEC) 20 MG capsule Take 20 mg by mouth daily.    [provider]  predniSONE (DELTASONE) 20 MG tablet 3 tabs po day one, then 2 po daily x 4 days 09/10/18   Loren Racer, MD    Family History No family history on file.  Social  History Social History   Tobacco Use  . Smoking status: Current Every Day Smoker  . Smokeless tobacco: Never Used  Substance Use Topics  . Alcohol use: Not Currently  . Drug use: Never     Allergies   Patient has no known allergies.   Review of Systems Review of Systems  Constitutional: Negative for chills and fever.  Musculoskeletal: Positive for arthralgias and myalgias. Negative for back pain, joint swelling and neck pain.  Skin: Negative for rash and wound.  Neurological: Positive for numbness. Negative for dizziness and weakness.  All other systems reviewed and are negative.    Physical Exam Updated Vital Signs BP 132/77 (BP Location: Right Arm)   Pulse 75   Temp 98.1 F (36.7 C) (Oral)   Resp 18   Ht 5\' 6"  (1.676 m)   Wt 90.7 kg   LMP 08/10/2018   SpO2 98%   BMI 32.28 kg/m   Physical Exam  Constitutional: She is oriented to person, place, and time. She appears well-developed and well-nourished.  HENT:  Head: Normocephalic and atraumatic.  Mouth/Throat: Oropharynx is clear and moist.  Eyes: Pupils are equal, round, and reactive to light. EOM are normal.  Neck: Normal range of motion. Neck supple.  No posterior midline cervical tenderness to palpation.  Cardiovascular: Normal rate and regular rhythm.  Pulmonary/Chest:  Effort normal and breath sounds normal.  Abdominal: Soft. Bowel sounds are normal. There is no tenderness. There is no rebound and no guarding.  Musculoskeletal: Normal range of motion. She exhibits tenderness. She exhibits no edema.  Patient has tenderness to palpation over the superior aspect of the left shoulder.  No obvious deformity.  No swelling or erythema.  Limited shoulder abduction due to pain.  Full range of motion of the left elbow without deformity or decreased range of motion.  Distal pulses are 2+.  Neurological: She is alert and oriented to person, place, and time.  5/5 grip strength bilaterally.  Sensation fully intact.  Limited  range of motion and strength of the left shoulder due to pain.  Skin: Skin is warm and dry. Capillary refill takes less than 2 seconds. No rash noted. No erythema.  Psychiatric: She has a normal mood and affect. Her behavior is normal.  Nursing note and vitals reviewed.    ED Treatments / Results  Labs (all labs ordered are listed, but only abnormal results are displayed) Labs Reviewed - No data to display  EKG None  Radiology Dg Shoulder Left  Result Date: 09/09/2018 CLINICAL DATA:  Left shoulder surgery on August 28th. Left shoulder popped tonight. EXAM: LEFT SHOULDER - 2+ VIEW COMPARISON:  06/11/2018 FINDINGS: There is no evidence of fracture or dislocation. There is no evidence of arthropathy or other focal bone abnormality. Soft tissues are unremarkable. IMPRESSION: Negative. Electronically Signed   By: Burman Nieves M.D.   On: 09/09/2018 21:34    Procedures Procedures (including critical care time)  Medications Ordered in ED Medications  ketorolac (TORADOL) injection 60 mg (60 mg Intramuscular Given 09/09/18 2140)  predniSONE (DELTASONE) tablet 60 mg (60 mg Oral Given 09/09/18 2140)  acetaminophen (TYLENOL) tablet 1,000 mg (1,000 mg Oral Given 09/09/18 2140)     Initial Impression / Assessment and Plan / ED Course  I have reviewed the triage vital signs and the nursing notes.  Pertinent labs & imaging results that were available during my care of the patient were reviewed by me and considered in my medical decision making (see chart for details).     No gross deformities or fracture seen on x-ray.  Likely reactivation of rotator cuff injury.  Final Clinical Impressions(s) / ED Diagnoses   Final diagnoses:  Sprain of left rotator cuff capsule, initial encounter    ED Discharge Orders         Ordered    predniSONE (DELTASONE) 20 MG tablet     09/09/18 2147    ibuprofen (ADVIL,MOTRIN) 600 MG tablet  Every 6 hours PRN     09/09/18 2147    acetaminophen (TYLENOL)  500 MG tablet  Every 6 hours PRN     09/09/18 2147           Loren Racer, MD 09/11/18 1924

## 2018-09-09 NOTE — Discharge Instructions (Signed)
Follow-up with your orthopedist.  °

## 2018-09-09 NOTE — ED Triage Notes (Signed)
Pt reports left shoulder surgery on August 28th. Pt states that tonight she was putting bed sheets on her bed when her left shoulder popped.

## 2018-09-13 DIAGNOSIS — M25512 Pain in left shoulder: Secondary | ICD-10-CM | POA: Diagnosis not present

## 2018-09-20 DIAGNOSIS — M25512 Pain in left shoulder: Secondary | ICD-10-CM | POA: Diagnosis not present

## 2018-09-24 DIAGNOSIS — M25512 Pain in left shoulder: Secondary | ICD-10-CM | POA: Diagnosis not present

## 2018-09-24 DIAGNOSIS — M67814 Other specified disorders of tendon, left shoulder: Secondary | ICD-10-CM | POA: Diagnosis not present

## 2018-10-01 DIAGNOSIS — M25512 Pain in left shoulder: Secondary | ICD-10-CM | POA: Diagnosis not present

## 2018-10-01 DIAGNOSIS — M67814 Other specified disorders of tendon, left shoulder: Secondary | ICD-10-CM | POA: Diagnosis not present

## 2018-10-08 DIAGNOSIS — M25512 Pain in left shoulder: Secondary | ICD-10-CM | POA: Diagnosis not present

## 2018-10-08 DIAGNOSIS — M67814 Other specified disorders of tendon, left shoulder: Secondary | ICD-10-CM | POA: Diagnosis not present

## 2018-10-11 DIAGNOSIS — M25512 Pain in left shoulder: Secondary | ICD-10-CM | POA: Diagnosis not present

## 2018-10-11 DIAGNOSIS — M542 Cervicalgia: Secondary | ICD-10-CM | POA: Diagnosis not present

## 2018-10-14 ENCOUNTER — Other Ambulatory Visit: Payer: Self-pay | Admitting: *Deleted

## 2018-10-14 DIAGNOSIS — R200 Anesthesia of skin: Secondary | ICD-10-CM

## 2018-10-15 ENCOUNTER — Ambulatory Visit (INDEPENDENT_AMBULATORY_CARE_PROVIDER_SITE_OTHER): Payer: BLUE CROSS/BLUE SHIELD | Admitting: Neurology

## 2018-10-15 DIAGNOSIS — R200 Anesthesia of skin: Secondary | ICD-10-CM

## 2018-10-15 NOTE — Procedures (Signed)
[facility]  [address] [place], [address]  [place], [place] [address] Tel: [phone] Fax:  [phone] Test Date:  10/15/2018  Patient: Elizabeth Richards DOB: 1976-11-21 Physician: Nita Sickle, DO  Sex: Female Height: 5\' 6"  Ref Phys: Baltazar Apo, MD  ID#: [id number] Temp: 37.0C Technician:    Patient Complaints: This is a 42 year-old female s/p left shoulder arthroscopy with persistent left arm paresthesias and pain.  NCV & EMG Findings: Extensive electrodiagnostic testing of the left upper extremity shows:  1. All sensory responses including the left median, ulnar, radial, mixed Palmer, medial and lateral antebrachial cutaneous sensory nerves are within normal limits. 2. All motor responses including the left median and ulnar nerves are within normal limits. 3. There is no evidence of active or chronic motor axonal loss changes affecting any of the tested muscles.  Motor unit configuration and recruitment pattern is within normal limits.  Impression: This is a normal study of the left upper extremity.  In particular, there is no evidence of a left brachial plexopathy, cervical radiculopathy, or entrapment syndrome.   ___________________________ Nita Sickle, DO    Nerve Conduction Studies Anti Sensory Summary Table   Site NR Peak (ms) Norm Peak (ms) P-T Amp (V) Norm P-T Amp  Left Lat Ante Brach Cutan Anti Sensory (Lat Forearm)  37C  Lat Biceps    1.6 <2.9 20.5 >14  Left Med Ante Brach Cutan Anti Sensory (Med Forearm)  37C  Elbow    2.0  16.2   Left Median Anti Sensory (2nd Digit)  37C  Wrist    2.6 <3.4 44.2 >20  Left Radial Anti Sensory (Base 1st Digit)  37C  Wrist    1.9 <2.7 31.1 >18  Left Ulnar Anti Sensory (5th Digit)  37C  Wrist    2.4 <3.1 30.2 >12   Motor Summary Table   Site NR Onset (ms) Norm Onset (ms) O-P Amp (mV) Norm O-P Amp Site1 Site2 Delta-0 (ms) Dist (cm) Vel (m/s) Norm Vel (m/s)  Left Median Motor (Abd Poll Brev)  37C  Wrist    2.4  <3.9 9.2 >6 Elbow Wrist 4.6 27.0 59 >50  Elbow    7.0  9.2         Left Ulnar Motor (Abd Dig Minimi)  37C  Wrist    2.0 <3.1 10.0 >7 B Elbow Wrist 3.4 23.0 68 >50  B Elbow    5.4  9.7  A Elbow B Elbow 1.6 10.0 63 >50  A Elbow    7.0  9.1          Comparison Summary Table   Site NR Peak (ms) Norm Peak (ms) P-T Amp (V) Site1 Site2 Delta-P (ms) Norm Delta (ms)  Left Median/Ulnar Palm Comparison (Wrist - 8cm)  37C  Median Palm    1.6 <2.2 58.7 Median Palm Ulnar Palm 0.3   Ulnar Palm    1.3 <2.2 27.4       EMG   Side Muscle Ins Act Fibs Psw Fasc Number Recrt Dur Dur. Amp Amp. Poly Poly. Comment  Left 1stDorInt Nml Nml Nml Nml Nml Nml Nml Nml Nml Nml Nml Nml N/A  Left Ext Indicis Nml Nml Nml Nml Nml Nml Nml Nml Nml Nml Nml Nml N/A  Left PronatorTeres Nml Nml Nml Nml Nml Nml Nml Nml Nml Nml Nml Nml N/A  Left Biceps Nml Nml Nml Nml Nml Nml Nml Nml Nml Nml Nml Nml N/A  Left Triceps Nml Nml Nml Nml Nml  Nml Nml Nml Nml Nml Nml Nml N/A  Left Deltoid Nml Nml Nml Nml Nml Nml Nml Nml Nml Nml Nml Nml N/A  Left Infraspinatus Nml Nml Nml Nml Nml Nml Nml Nml Nml Nml Nml Nml N/A  Left Cervical Parasp Low Nml Nml Nml Nml Nml Nml Nml Nml Nml Nml Nml Nml N/A      Waveforms:

## 2018-10-16 ENCOUNTER — Other Ambulatory Visit: Payer: Self-pay | Admitting: Orthopedic Surgery

## 2018-10-16 DIAGNOSIS — M542 Cervicalgia: Secondary | ICD-10-CM

## 2018-10-16 DIAGNOSIS — R200 Anesthesia of skin: Secondary | ICD-10-CM

## 2018-10-16 DIAGNOSIS — R202 Paresthesia of skin: Secondary | ICD-10-CM

## 2018-10-20 ENCOUNTER — Ambulatory Visit
Admission: RE | Admit: 2018-10-20 | Discharge: 2018-10-20 | Disposition: A | Discharge status: Home / Self Care | Payer: BLUE CROSS/BLUE SHIELD | Source: Ambulatory Visit | Attending: Orthopedic Surgery | Admitting: Orthopedic Surgery

## 2018-10-20 DIAGNOSIS — M542 Cervicalgia: Secondary | ICD-10-CM

## 2018-10-20 DIAGNOSIS — R202 Paresthesia of skin: Secondary | ICD-10-CM

## 2018-10-20 DIAGNOSIS — R200 Anesthesia of skin: Secondary | ICD-10-CM

## 2018-10-20 DIAGNOSIS — M4802 Spinal stenosis, cervical region: Secondary | ICD-10-CM | POA: Diagnosis not present

## 2018-10-29 ENCOUNTER — Other Ambulatory Visit: Payer: Self-pay | Admitting: Orthopedic Surgery

## 2018-10-29 DIAGNOSIS — M25512 Pain in left shoulder: Secondary | ICD-10-CM

## 2018-11-05 DIAGNOSIS — H0012 Chalazion right lower eyelid: Secondary | ICD-10-CM | POA: Diagnosis not present

## 2018-11-05 DIAGNOSIS — H00012 Hordeolum externum right lower eyelid: Secondary | ICD-10-CM | POA: Diagnosis not present

## 2018-12-10 DIAGNOSIS — A084 Viral intestinal infection, unspecified: Secondary | ICD-10-CM | POA: Diagnosis not present

## 2018-12-10 DIAGNOSIS — R6883 Chills (without fever): Secondary | ICD-10-CM | POA: Diagnosis not present

## 2018-12-10 DIAGNOSIS — Z6835 Body mass index (BMI) 35.0-35.9, adult: Secondary | ICD-10-CM | POA: Diagnosis not present

## 2018-12-13 DIAGNOSIS — F411 Generalized anxiety disorder: Secondary | ICD-10-CM | POA: Diagnosis not present

## 2018-12-13 DIAGNOSIS — F3341 Major depressive disorder, recurrent, in partial remission: Secondary | ICD-10-CM | POA: Diagnosis not present

## 2018-12-13 DIAGNOSIS — E782 Mixed hyperlipidemia: Secondary | ICD-10-CM | POA: Diagnosis not present

## 2018-12-13 DIAGNOSIS — Z720 Tobacco use: Secondary | ICD-10-CM | POA: Diagnosis not present

## 2018-12-13 DIAGNOSIS — E6609 Other obesity due to excess calories: Secondary | ICD-10-CM | POA: Diagnosis not present

## 2018-12-13 DIAGNOSIS — K210 Gastro-esophageal reflux disease with esophagitis: Secondary | ICD-10-CM | POA: Diagnosis not present

## 2019-01-24 DIAGNOSIS — Z6835 Body mass index (BMI) 35.0-35.9, adult: Secondary | ICD-10-CM | POA: Diagnosis not present

## 2019-01-24 DIAGNOSIS — J101 Influenza due to other identified influenza virus with other respiratory manifestations: Secondary | ICD-10-CM | POA: Diagnosis not present

## 2019-01-24 DIAGNOSIS — R52 Pain, unspecified: Secondary | ICD-10-CM | POA: Diagnosis not present

## 2019-05-30 DIAGNOSIS — Z716 Tobacco abuse counseling: Secondary | ICD-10-CM | POA: Diagnosis not present

## 2019-05-30 DIAGNOSIS — F411 Generalized anxiety disorder: Secondary | ICD-10-CM | POA: Diagnosis not present

## 2019-05-30 DIAGNOSIS — K210 Gastro-esophageal reflux disease with esophagitis: Secondary | ICD-10-CM | POA: Diagnosis not present

## 2019-05-30 DIAGNOSIS — E782 Mixed hyperlipidemia: Secondary | ICD-10-CM | POA: Diagnosis not present

## 2019-06-04 DIAGNOSIS — Z720 Tobacco use: Secondary | ICD-10-CM | POA: Diagnosis not present

## 2019-06-04 DIAGNOSIS — Z1389 Encounter for screening for other disorder: Secondary | ICD-10-CM | POA: Diagnosis not present

## 2019-06-04 DIAGNOSIS — K210 Gastro-esophageal reflux disease with esophagitis: Secondary | ICD-10-CM | POA: Diagnosis not present

## 2019-06-04 DIAGNOSIS — F3341 Major depressive disorder, recurrent, in partial remission: Secondary | ICD-10-CM | POA: Diagnosis not present

## 2019-06-04 DIAGNOSIS — E782 Mixed hyperlipidemia: Secondary | ICD-10-CM | POA: Diagnosis not present

## 2019-06-04 DIAGNOSIS — F411 Generalized anxiety disorder: Secondary | ICD-10-CM | POA: Diagnosis not present

## 2019-06-13 DIAGNOSIS — I83211 Varicose veins of right lower extremity with both ulcer of thigh and inflammation: Secondary | ICD-10-CM | POA: Diagnosis not present

## 2019-06-13 DIAGNOSIS — I8311 Varicose veins of right lower extremity with inflammation: Secondary | ICD-10-CM | POA: Diagnosis not present

## 2019-06-25 DIAGNOSIS — S39012A Strain of muscle, fascia and tendon of lower back, initial encounter: Secondary | ICD-10-CM | POA: Diagnosis not present

## 2019-06-25 DIAGNOSIS — M5431 Sciatica, right side: Secondary | ICD-10-CM | POA: Diagnosis not present

## 2019-06-25 DIAGNOSIS — Z6833 Body mass index (BMI) 33.0-33.9, adult: Secondary | ICD-10-CM | POA: Diagnosis not present

## 2019-07-04 DIAGNOSIS — I8001 Phlebitis and thrombophlebitis of superficial vessels of right lower extremity: Secondary | ICD-10-CM | POA: Diagnosis not present

## 2019-07-04 DIAGNOSIS — I8311 Varicose veins of right lower extremity with inflammation: Secondary | ICD-10-CM | POA: Diagnosis not present

## 2019-07-08 DIAGNOSIS — R05 Cough: Secondary | ICD-10-CM | POA: Diagnosis not present

## 2019-07-08 DIAGNOSIS — J069 Acute upper respiratory infection, unspecified: Secondary | ICD-10-CM | POA: Diagnosis not present

## 2019-07-08 DIAGNOSIS — J209 Acute bronchitis, unspecified: Secondary | ICD-10-CM | POA: Diagnosis not present

## 2019-09-18 IMAGING — DX DG FINGER INDEX 2+V*L*
3 series · 3 of 3 positions shown · non-contrast
Comparison: None.

CLINICAL DATA: Blunt trauma, finger between Domingos Chivinda and Hiraki Amann.

EXAM:
LEFT INDEX FINGER 2+V

[finger ap]
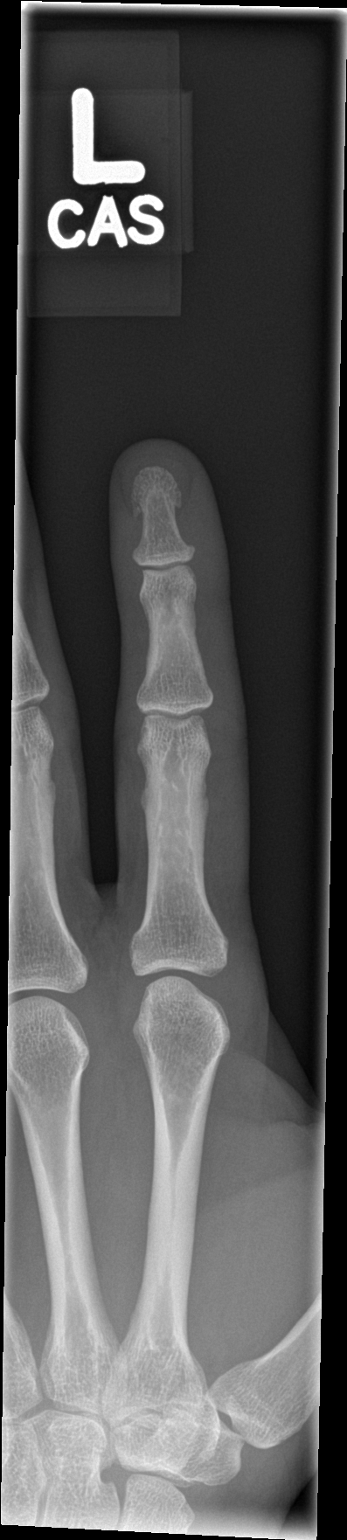

[finger obl]
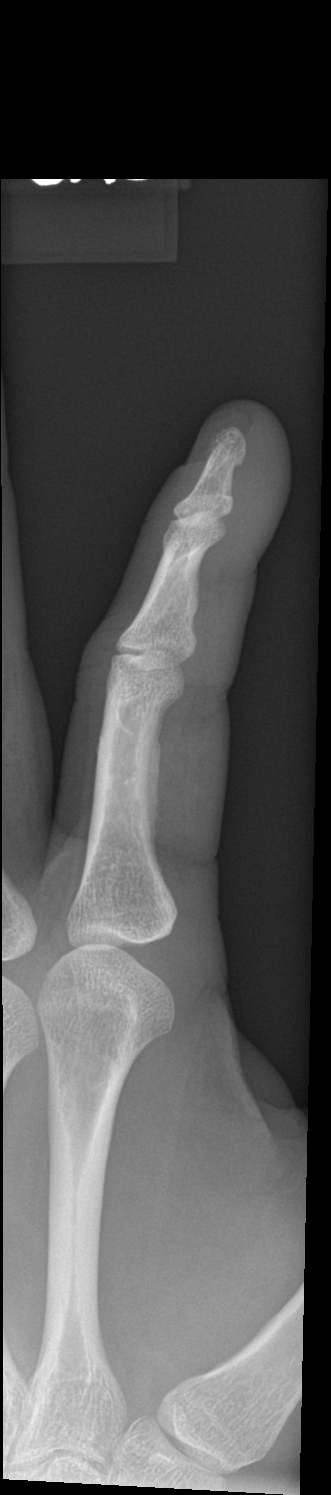

[finger lat]
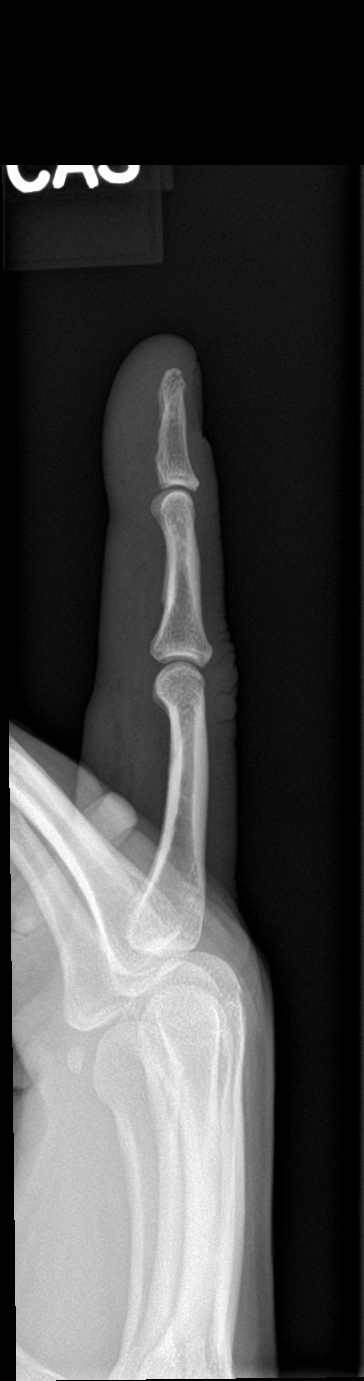

[3 of 3 positions shown; findings below may reference images not displayed]

FINDINGS: There is no evidence of fracture or dislocation. There is no
evidence of arthropathy or other focal bone abnormality. Soft
tissues are unremarkable.
IMPRESSION: Negative.
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
## 2024-07-07 DIAGNOSIS — M25612 Stiffness of left shoulder, not elsewhere classified: Secondary | ICD-10-CM | POA: Diagnosis not present

## 2024-07-07 DIAGNOSIS — M25512 Pain in left shoulder: Secondary | ICD-10-CM | POA: Diagnosis not present

## 2024-07-11 DIAGNOSIS — M25512 Pain in left shoulder: Secondary | ICD-10-CM | POA: Diagnosis not present

## 2024-07-11 DIAGNOSIS — M25612 Stiffness of left shoulder, not elsewhere classified: Secondary | ICD-10-CM | POA: Diagnosis not present

## 2024-07-14 DIAGNOSIS — M25512 Pain in left shoulder: Secondary | ICD-10-CM | POA: Diagnosis not present

## 2024-07-14 DIAGNOSIS — M25612 Stiffness of left shoulder, not elsewhere classified: Secondary | ICD-10-CM | POA: Diagnosis not present

## 2024-07-16 DIAGNOSIS — M25512 Pain in left shoulder: Secondary | ICD-10-CM | POA: Diagnosis not present

## 2024-07-16 DIAGNOSIS — M25612 Stiffness of left shoulder, not elsewhere classified: Secondary | ICD-10-CM | POA: Diagnosis not present

## 2024-07-21 DIAGNOSIS — M25512 Pain in left shoulder: Secondary | ICD-10-CM | POA: Diagnosis not present

## 2024-07-21 DIAGNOSIS — M25612 Stiffness of left shoulder, not elsewhere classified: Secondary | ICD-10-CM | POA: Diagnosis not present

## 2024-07-24 DIAGNOSIS — M25612 Stiffness of left shoulder, not elsewhere classified: Secondary | ICD-10-CM | POA: Diagnosis not present

## 2024-07-24 DIAGNOSIS — M25512 Pain in left shoulder: Secondary | ICD-10-CM | POA: Diagnosis not present

## 2024-07-31 DIAGNOSIS — F4310 Post-traumatic stress disorder, unspecified: Secondary | ICD-10-CM | POA: Diagnosis not present

## 2024-07-31 DIAGNOSIS — F332 Major depressive disorder, recurrent severe without psychotic features: Secondary | ICD-10-CM | POA: Diagnosis not present

## 2024-07-31 DIAGNOSIS — F411 Generalized anxiety disorder: Secondary | ICD-10-CM | POA: Diagnosis not present

## 2024-08-13 DIAGNOSIS — F332 Major depressive disorder, recurrent severe without psychotic features: Secondary | ICD-10-CM | POA: Diagnosis not present

## 2024-08-18 DIAGNOSIS — F3289 Other specified depressive episodes: Secondary | ICD-10-CM | POA: Diagnosis not present

## 2024-08-18 DIAGNOSIS — F50811 Binge eating disorder, moderate: Secondary | ICD-10-CM | POA: Diagnosis not present

## 2024-08-18 DIAGNOSIS — Z6829 Body mass index (BMI) 29.0-29.9, adult: Secondary | ICD-10-CM | POA: Diagnosis not present

## 2024-08-18 DIAGNOSIS — E782 Mixed hyperlipidemia: Secondary | ICD-10-CM | POA: Diagnosis not present

## 2024-08-18 DIAGNOSIS — G43909 Migraine, unspecified, not intractable, without status migrainosus: Secondary | ICD-10-CM | POA: Diagnosis not present

## 2024-08-18 DIAGNOSIS — F419 Anxiety disorder, unspecified: Secondary | ICD-10-CM | POA: Diagnosis not present

## 2024-08-18 DIAGNOSIS — R7303 Prediabetes: Secondary | ICD-10-CM | POA: Diagnosis not present

## 2024-08-18 DIAGNOSIS — Z9189 Other specified personal risk factors, not elsewhere classified: Secondary | ICD-10-CM | POA: Diagnosis not present

## 2024-09-01 DIAGNOSIS — F4310 Post-traumatic stress disorder, unspecified: Secondary | ICD-10-CM | POA: Diagnosis not present

## 2024-09-01 DIAGNOSIS — F411 Generalized anxiety disorder: Secondary | ICD-10-CM | POA: Diagnosis not present

## 2024-09-01 DIAGNOSIS — F332 Major depressive disorder, recurrent severe without psychotic features: Secondary | ICD-10-CM | POA: Diagnosis not present

## 2024-09-17 DIAGNOSIS — H52209 Unspecified astigmatism, unspecified eye: Secondary | ICD-10-CM | POA: Diagnosis not present

## 2024-09-17 DIAGNOSIS — H524 Presbyopia: Secondary | ICD-10-CM | POA: Diagnosis not present

## 2024-09-17 DIAGNOSIS — H5203 Hypermetropia, bilateral: Secondary | ICD-10-CM | POA: Diagnosis not present

## 2024-09-18 DIAGNOSIS — F419 Anxiety disorder, unspecified: Secondary | ICD-10-CM | POA: Diagnosis not present

## 2024-09-18 DIAGNOSIS — Z9189 Other specified personal risk factors, not elsewhere classified: Secondary | ICD-10-CM | POA: Diagnosis not present

## 2024-09-18 DIAGNOSIS — F3289 Other specified depressive episodes: Secondary | ICD-10-CM | POA: Diagnosis not present

## 2024-09-18 DIAGNOSIS — G43909 Migraine, unspecified, not intractable, without status migrainosus: Secondary | ICD-10-CM | POA: Diagnosis not present

## 2024-09-18 DIAGNOSIS — F50811 Binge eating disorder, moderate: Secondary | ICD-10-CM | POA: Diagnosis not present

## 2024-09-18 DIAGNOSIS — E782 Mixed hyperlipidemia: Secondary | ICD-10-CM | POA: Diagnosis not present

## 2024-09-18 DIAGNOSIS — Z6830 Body mass index (BMI) 30.0-30.9, adult: Secondary | ICD-10-CM | POA: Diagnosis not present

## 2024-09-18 DIAGNOSIS — R7303 Prediabetes: Secondary | ICD-10-CM | POA: Diagnosis not present

## 2024-09-29 DIAGNOSIS — F411 Generalized anxiety disorder: Secondary | ICD-10-CM | POA: Diagnosis not present

## 2024-09-29 DIAGNOSIS — F332 Major depressive disorder, recurrent severe without psychotic features: Secondary | ICD-10-CM | POA: Diagnosis not present

## 2024-09-29 DIAGNOSIS — F4310 Post-traumatic stress disorder, unspecified: Secondary | ICD-10-CM | POA: Diagnosis not present

## 2024-10-03 DIAGNOSIS — F332 Major depressive disorder, recurrent severe without psychotic features: Secondary | ICD-10-CM | POA: Diagnosis not present

## 2024-10-13 DIAGNOSIS — M9901 Segmental and somatic dysfunction of cervical region: Secondary | ICD-10-CM | POA: Diagnosis not present

## 2024-10-13 DIAGNOSIS — M546 Pain in thoracic spine: Secondary | ICD-10-CM | POA: Diagnosis not present

## 2024-10-13 DIAGNOSIS — M9902 Segmental and somatic dysfunction of thoracic region: Secondary | ICD-10-CM | POA: Diagnosis not present

## 2024-10-13 DIAGNOSIS — M542 Cervicalgia: Secondary | ICD-10-CM | POA: Diagnosis not present

## 2024-10-27 DIAGNOSIS — F4310 Post-traumatic stress disorder, unspecified: Secondary | ICD-10-CM | POA: Diagnosis not present

## 2024-10-27 DIAGNOSIS — F332 Major depressive disorder, recurrent severe without psychotic features: Secondary | ICD-10-CM | POA: Diagnosis not present

## 2024-10-27 DIAGNOSIS — F411 Generalized anxiety disorder: Secondary | ICD-10-CM | POA: Diagnosis not present

## 2024-11-10 DIAGNOSIS — E782 Mixed hyperlipidemia: Secondary | ICD-10-CM | POA: Diagnosis not present

## 2024-11-10 DIAGNOSIS — Z6830 Body mass index (BMI) 30.0-30.9, adult: Secondary | ICD-10-CM | POA: Diagnosis not present

## 2024-11-10 DIAGNOSIS — F3289 Other specified depressive episodes: Secondary | ICD-10-CM | POA: Diagnosis not present

## 2024-11-10 DIAGNOSIS — Z9189 Other specified personal risk factors, not elsewhere classified: Secondary | ICD-10-CM | POA: Diagnosis not present

## 2024-11-10 DIAGNOSIS — R7303 Prediabetes: Secondary | ICD-10-CM | POA: Diagnosis not present

## 2024-11-10 DIAGNOSIS — F419 Anxiety disorder, unspecified: Secondary | ICD-10-CM | POA: Diagnosis not present

## 2024-11-10 DIAGNOSIS — F50811 Binge eating disorder, moderate: Secondary | ICD-10-CM | POA: Diagnosis not present

## 2024-11-10 DIAGNOSIS — G43909 Migraine, unspecified, not intractable, without status migrainosus: Secondary | ICD-10-CM | POA: Diagnosis not present

## 2024-11-25 DIAGNOSIS — F4310 Post-traumatic stress disorder, unspecified: Secondary | ICD-10-CM | POA: Diagnosis not present

## 2024-11-25 DIAGNOSIS — F411 Generalized anxiety disorder: Secondary | ICD-10-CM | POA: Diagnosis not present

## 2024-11-25 DIAGNOSIS — F332 Major depressive disorder, recurrent severe without psychotic features: Secondary | ICD-10-CM | POA: Diagnosis not present
